# Patient Record
Sex: Female | Born: 1955 | Race: White | Hispanic: No | Marital: Married | Smoking: Never smoker
Health system: Southern US, Community
[De-identification: ages and names within clinical notes are randomized; demographics above are authoritative.]

## PROBLEM LIST (undated history)

## (undated) HISTORY — PX: OVARY SURGERY: SHX727

---

## 2005-03-26 ENCOUNTER — Ambulatory Visit: Payer: Self-pay | Admitting: Obstetrics and Gynecology

## 2006-04-08 ENCOUNTER — Ambulatory Visit: Payer: Self-pay | Admitting: Obstetrics and Gynecology

## 2006-04-18 ENCOUNTER — Ambulatory Visit: Payer: Self-pay | Admitting: Unknown Physician Specialty

## 2007-03-14 ENCOUNTER — Ambulatory Visit: Payer: Self-pay | Admitting: Unknown Physician Specialty

## 2007-05-05 ENCOUNTER — Ambulatory Visit: Payer: Self-pay | Admitting: Obstetrics and Gynecology

## 2008-06-04 ENCOUNTER — Ambulatory Visit: Payer: Self-pay | Admitting: Obstetrics and Gynecology

## 2009-06-06 ENCOUNTER — Ambulatory Visit: Payer: Self-pay | Admitting: Obstetrics and Gynecology

## 2010-07-07 ENCOUNTER — Ambulatory Visit: Payer: Self-pay | Admitting: Obstetrics and Gynecology

## 2011-08-11 ENCOUNTER — Ambulatory Visit: Payer: Self-pay | Admitting: Obstetrics and Gynecology

## 2012-08-15 ENCOUNTER — Ambulatory Visit: Payer: Self-pay | Admitting: Obstetrics and Gynecology

## 2013-08-21 ENCOUNTER — Ambulatory Visit: Payer: Self-pay | Admitting: Obstetrics and Gynecology

## 2014-09-24 ENCOUNTER — Ambulatory Visit
Admit: 2014-09-24 | Disposition: A | Discharge status: Home / Self Care | Payer: Self-pay | Attending: Obstetrics and Gynecology | Admitting: Obstetrics and Gynecology

## 2015-07-22 ENCOUNTER — Other Ambulatory Visit: Payer: Self-pay | Admitting: Obstetrics and Gynecology

## 2015-07-22 DIAGNOSIS — Z1231 Encounter for screening mammogram for malignant neoplasm of breast: Secondary | ICD-10-CM

## 2015-09-26 ENCOUNTER — Ambulatory Visit
Admission: RE | Admit: 2015-09-26 | Discharge: 2015-09-26 | Disposition: A | Discharge status: Home / Self Care | Payer: Managed Care, Other (non HMO) | Source: Ambulatory Visit | Attending: Obstetrics and Gynecology | Admitting: Obstetrics and Gynecology

## 2015-09-26 DIAGNOSIS — Z1231 Encounter for screening mammogram for malignant neoplasm of breast: Secondary | ICD-10-CM | POA: Diagnosis not present

## 2016-08-10 ENCOUNTER — Other Ambulatory Visit: Payer: Self-pay | Admitting: Obstetrics and Gynecology

## 2016-08-10 DIAGNOSIS — Z1231 Encounter for screening mammogram for malignant neoplasm of breast: Secondary | ICD-10-CM

## 2016-08-20 ENCOUNTER — Telehealth: Payer: Self-pay

## 2016-08-20 ENCOUNTER — Other Ambulatory Visit: Payer: Self-pay

## 2016-08-20 DIAGNOSIS — K279 Peptic ulcer, site unspecified, unspecified as acute or chronic, without hemorrhage or perforation: Secondary | ICD-10-CM | POA: Insufficient documentation

## 2016-08-20 DIAGNOSIS — K759 Inflammatory liver disease, unspecified: Secondary | ICD-10-CM | POA: Insufficient documentation

## 2016-08-20 DIAGNOSIS — E785 Hyperlipidemia, unspecified: Secondary | ICD-10-CM | POA: Insufficient documentation

## 2016-08-20 DIAGNOSIS — Z1211 Encounter for screening for malignant neoplasm of colon: Secondary | ICD-10-CM

## 2016-08-20 NOTE — Telephone Encounter (Signed)
LVM concerning re-scheduling appointment for colonoscopy from 09/10/16.

## 2016-08-20 NOTE — Telephone Encounter (Signed)
Gastroenterology Pre-Procedure Review  Request Date: 09/24/16 Requesting Physician: Dr. Allen Norris  PATIENT REVIEW QUESTIONS: The patient responded to the following health history questions as indicated:    1. Are you having any GI issues? no 2. Do you have a personal history of Polyps? no 3. Do you have a family history of Colon Cancer or Polyps? no 4. Diabetes Mellitus? no 5. Joint replacements in the past 12 months?no 6. Major health problems in the past 3 months?no 7. Any artificial heart valves, MVP, or defibrillator?no    MEDICATIONS & ALLERGIES:    Patient reports the following regarding taking any anticoagulation/antiplatelet therapy:   Plavix, Coumadin, Eliquis, Xarelto, Lovenox, Pradaxa, Brilinta, or Effient? no Aspirin? no  Patient confirms/reports the following medications:  No current outpatient prescriptions on file.   No current facility-administered medications for this visit.     Patient confirms/reports the following allergies:  Allergies not on file  No orders of the defined types were placed in this encounter.   AUTHORIZATION INFORMATION Primary Insurance: 1D#: Group #:  Secondary Insurance: 1D#: Group #:  SCHEDULE INFORMATION: Date: 09/25/16 Time: Location: [place]

## 2016-09-20 NOTE — Discharge Instructions (Signed)

## 2016-09-24 ENCOUNTER — Ambulatory Visit
Admission: RE | Admit: 2016-09-24 | Discharge: 2016-09-24 | Disposition: A | Discharge status: Home / Self Care | Payer: Managed Care, Other (non HMO) | Source: Ambulatory Visit | Attending: Gastroenterology | Admitting: Gastroenterology

## 2016-09-24 ENCOUNTER — Ambulatory Visit: Payer: Managed Care, Other (non HMO) | Admitting: Anesthesiology

## 2016-09-24 ENCOUNTER — Encounter
Admission: RE | Disposition: A | Discharge status: Home / Self Care | Payer: Self-pay | Source: Ambulatory Visit | Attending: Gastroenterology

## 2016-09-24 DIAGNOSIS — K635 Polyp of colon: Secondary | ICD-10-CM

## 2016-09-24 DIAGNOSIS — Z1211 Encounter for screening for malignant neoplasm of colon: Secondary | ICD-10-CM | POA: Diagnosis not present

## 2016-09-24 DIAGNOSIS — K641 Second degree hemorrhoids: Secondary | ICD-10-CM | POA: Diagnosis not present

## 2016-09-24 DIAGNOSIS — D125 Benign neoplasm of sigmoid colon: Secondary | ICD-10-CM | POA: Diagnosis not present

## 2016-09-24 DIAGNOSIS — Z8711 Personal history of peptic ulcer disease: Secondary | ICD-10-CM | POA: Insufficient documentation

## 2016-09-24 HISTORY — PX: POLYPECTOMY: SHX5525

## 2016-09-24 HISTORY — PX: COLONOSCOPY WITH PROPOFOL: SHX5780

## 2016-09-24 SURGERY — COLONOSCOPY WITH PROPOFOL
Anesthesia: Monitor Anesthesia Care | Wound class: Contaminated

## 2016-09-24 MED ORDER — OXYCODONE HCL 5 MG/5ML PO SOLN: 5.0000 mg | Freq: Once | ORAL | Status: DC | PRN

## 2016-09-24 MED ORDER — FENTANYL CITRATE (PF) 100 MCG/2ML IJ SOLN: 25.0000 ug | INTRAMUSCULAR | Status: DC | PRN

## 2016-09-24 MED ORDER — LIDOCAINE HCL (CARDIAC) 20 MG/ML IV SOLN
INTRAVENOUS | Status: DC | PRN
  Administered 2016-09-24: 40 mg via INTRAVENOUS

## 2016-09-24 MED ORDER — LACTATED RINGERS IV SOLN
INTRAVENOUS | Status: DC | PRN
  Administered 2016-09-24: 08:00:00 via INTRAVENOUS

## 2016-09-24 MED ORDER — DEXAMETHASONE SODIUM PHOSPHATE 4 MG/ML IJ SOLN: 8.0000 mg | Freq: Once | INTRAMUSCULAR | Status: DC | PRN

## 2016-09-24 MED ORDER — STERILE WATER FOR IRRIGATION IR SOLN
Status: DC | PRN
  Administered 2016-09-24: 08:00:00

## 2016-09-24 MED ORDER — OXYCODONE HCL 5 MG PO TABS: 5.0000 mg | ORAL_TABLET | Freq: Once | ORAL | Status: DC | PRN

## 2016-09-24 MED ORDER — ACETAMINOPHEN 325 MG PO TABS: 325.0000 mg | ORAL_TABLET | ORAL | Status: DC | PRN

## 2016-09-24 MED ORDER — PROPOFOL 10 MG/ML IV BOLUS
INTRAVENOUS | Status: DC | PRN
  Administered 2016-09-24: 50 mg via INTRAVENOUS
  Administered 2016-09-24: 20 mg via INTRAVENOUS
  Administered 2016-09-24: 50 mg via INTRAVENOUS
  Administered 2016-09-24: 100 mg via INTRAVENOUS
  Administered 2016-09-24: 50 mg via INTRAVENOUS

## 2016-09-24 MED ORDER — ACETAMINOPHEN 160 MG/5ML PO SOLN: 325.0000 mg | ORAL | Status: DC | PRN

## 2016-09-24 SURGICAL SUPPLY — 23 items

## 2016-09-24 NOTE — H&P (Signed)
  Lucilla Lame, MD [address], [place] [address] Phone: [phone] Fax : [phone]  Primary Care Physician:  Idelle Crouch, MD Primary Gastroenterologist:  Dr. Allen Norris  Pre-Procedure History & Physical: HPI:  Karen Tran is a 61 y.o. female is here for a screening colonoscopy.   History reviewed. No pertinent past medical history.  Past Surgical History:  Procedure Laterality Date  . OVARY SURGERY     removal    Prior to Admission medications   Medication Sig Start Date End Date Taking? Authorizing Provider  Multiple Vitamin (MULTIVITAMIN) tablet Take 1 tablet by mouth daily.   Yes Historical Provider, MD  Omega-3 Fatty Acids (FISH OIL CONCENTRATE PO) Take by mouth daily.   Yes Historical Provider, MD    Allergies as of 08/20/2016  . (Not on File)    Family History  Problem Relation Age of Onset  . Breast cancer Neg Hx     Social History   Social History  . Marital status: Married    Spouse name: N/A  . Number of children: N/A  . Years of education: N/A   Occupational History  . Not on file.   Social History Main Topics  . Smoking status: Never Smoker  . Smokeless tobacco: Never Used  . Alcohol use Yes     Comment: socially  . Drug use: No  . Sexual activity: Not on file   Other Topics Concern  . Not on file   Social History Narrative  . No narrative on file    Review of Systems: See HPI, otherwise negative ROS  Physical Exam: BP (!) 110/49   Pulse 66   Temp 97.8 F (36.6 C) (Temporal)   Ht 5\' 5"  (1.651 m)   Wt 117 lb (53.1 kg)   SpO2 100%   BMI 19.47 kg/m  General:   Alert,  pleasant and cooperative in NAD Head:  Normocephalic and atraumatic. Neck:  Supple; no masses or thyromegaly. Lungs:  Clear throughout to auscultation.    Heart:  Regular rate and rhythm. Abdomen:  Soft, nontender and nondistended. Normal bowel sounds, without guarding, and without rebound.   Neurologic:  Alert and  oriented x4;   grossly normal neurologically.  Impression/Plan: Karen Tran is now here to undergo a screening colonoscopy.  Risks, benefits, and alternatives regarding colonoscopy have been reviewed with the patient.  Questions have been answered.  All parties agreeable.

## 2016-09-24 NOTE — H&P (Signed)
  Lucilla Lame, MD [address] [place], [place] [address] Phone: [phone] Fax : [phone]  Primary Care Physician:  Idelle Crouch, MD Primary Gastroenterologist:  Dr. Allen Norris  Pre-Procedure History & Physical: HPI:  Karen Tran is a 61 y.o. female is here for a screening colonoscopy.   History reviewed. No pertinent past medical history.  Past Surgical History:  Procedure Laterality Date  . OVARY SURGERY     removal    Prior to Admission medications   Medication Sig Start Date End Date Taking? Authorizing Provider  Multiple Vitamin (MULTIVITAMIN) tablet Take 1 tablet by mouth daily.   Yes Historical Provider, MD  Omega-3 Fatty Acids (FISH OIL CONCENTRATE PO) Take by mouth daily.   Yes Historical Provider, MD    Allergies as of 08/20/2016  . (Not on File)    Family History  Problem Relation Age of Onset  . Breast cancer Neg Hx     Social History   Social History  . Marital status: Married    Spouse name: N/A  . Number of children: N/A  . Years of education: N/A   Occupational History  . Not on file.   Social History Main Topics  . Smoking status: Never Smoker  . Smokeless tobacco: Never Used  . Alcohol use Yes     Comment: socially  . Drug use: No  . Sexual activity: Not on file   Other Topics Concern  . Not on file   Social History Narrative  . No narrative on file    Review of Systems: See HPI, otherwise negative ROS  Physical Exam: BP (!) 110/49   Pulse 66   Temp 97.8 F (36.6 C) (Temporal)   Ht 5\' 5"  (1.651 m)   Wt 117 lb (53.1 kg)   SpO2 100%   BMI 19.47 kg/m  General:   Alert,  pleasant and cooperative in NAD Head:  Normocephalic and atraumatic. Neck:  Supple; no masses or thyromegaly. Lungs:  Clear throughout to auscultation.    Heart:  Regular rate and rhythm. Abdomen:  Soft, nontender and nondistended. Normal bowel sounds, without guarding, and without rebound.   Neurologic:  Alert and  oriented x4;   grossly normal neurologically.  Impression/Plan: Karen Tran is now here to undergo a screening colonoscopy.  Risks, benefits, and alternatives regarding colonoscopy have been reviewed with the patient.  Questions have been answered.  All parties agreeable.

## 2016-09-24 NOTE — Anesthesia Preprocedure Evaluation (Signed)
Anesthesia Evaluation  Patient identified by MRN, date of birth, ID band Patient awake    Reviewed: Allergy & Precautions, H&P , NPO status , Patient's Chart, lab work & pertinent test results, reviewed documented beta blocker date and time   Airway Mallampati: I  TM Distance: >3 FB Neck ROM: full    Dental no notable dental hx.    Pulmonary neg pulmonary ROS,    Pulmonary exam normal breath sounds clear to auscultation       Cardiovascular Exercise Tolerance: Good negative cardio ROS   Rhythm:regular Rate:Normal     Neuro/Psych negative neurological ROS  negative psych ROS   GI/Hepatic PUD, (+) Hepatitis -  Endo/Other  negative endocrine ROS  Renal/GU negative Renal ROS  negative genitourinary   Musculoskeletal   Abdominal   Peds  Hematology negative hematology ROS (+)   Anesthesia Other Findings   Reproductive/Obstetrics negative OB ROS                             Anesthesia Physical Anesthesia Plan  ASA: II  Anesthesia Plan: MAC   Post-op Pain Management:    Induction:   Airway Management Planned:   Additional Equipment:   Intra-op Plan:   Post-operative Plan:   Informed Consent: I have reviewed the patients History and Physical, chart, labs and discussed the procedure including the risks, benefits and alternatives for the proposed anesthesia with the patient or authorized representative who has indicated his/her understanding and acceptance.     Plan Discussed with: CRNA  Anesthesia Plan Comments:         Anesthesia Quick Evaluation

## 2016-09-24 NOTE — Transfer of Care (Signed)
Immediate Anesthesia Transfer of Care Note  Patient: [place]  Procedure(s) Performed: Procedure(s): COLONOSCOPY WITH PROPOFOL (N/A) POLYPECTOMY  Patient Location: PACU  Anesthesia Type: MAC  Level of Consciousness: awake, alert  and patient cooperative  Airway and Oxygen Therapy: Patient Spontanous Breathing and Patient connected to supplemental oxygen  Post-op Assessment: Post-op Vital signs reviewed, Patient's Cardiovascular Status Stable, Respiratory Function Stable, Patent Airway and No signs of Nausea or vomiting  Post-op Vital Signs: Reviewed and stable  Complications: No apparent anesthesia complications

## 2016-09-24 NOTE — Anesthesia Postprocedure Evaluation (Signed)
Anesthesia Post Note  Patient: Sandford Craze  Procedure(s) Performed: Procedure(s) (LRB): COLONOSCOPY WITH PROPOFOL (N/A) POLYPECTOMY  Patient location during evaluation: PACU Anesthesia Type: MAC Level of consciousness: awake and alert Pain management: pain level controlled Vital Signs Assessment: post-procedure vital signs reviewed and stable Respiratory status: spontaneous breathing, nonlabored ventilation and respiratory function stable Cardiovascular status: stable and blood pressure returned to baseline Anesthetic complications: no    Jenavieve Freda D Chelsee Hosie

## 2016-09-24 NOTE — Anesthesia Procedure Notes (Signed)
Procedure Name: MAC Date/Time: 09/24/2016 8:00 AM Performed by: Janna Arch Pre-anesthesia Checklist: Patient identified, Emergency Drugs available, Suction available and Patient being monitored Patient Re-evaluated:Patient Re-evaluated prior to inductionOxygen Delivery Method: Nasal cannula

## 2016-09-24 NOTE — Op Note (Signed)
[facility] Gastroenterology Patient Name: Karen Tran Procedure Date: 09/24/2016 7:57 AM MRN: [id number] Account #: [id number] Date of Birth: 1956/04/29 Admit Type: Outpatient Age: 61 Room: [facility] OR ROOM 01 Gender: Female Note Status: Finalized Procedure:            Colonoscopy Indications:          Screening for colorectal malignant neoplasm Providers:            Lucilla Lame MD, MD Referring MD:         Boykin Nearing, MD (Referring MD) Medicines:            Propofol per Anesthesia Complications:        No immediate complications. Procedure:            Pre-Anesthesia Assessment:                       - Prior to the procedure, a History and Physical was                        performed, and patient medications and allergies were                        reviewed. The patient's tolerance of previous                        anesthesia was also reviewed. The risks and benefits of                        the procedure and the sedation options and risks were                        discussed with the patient. All questions were                        answered, and informed consent was obtained. Prior                        Anticoagulants: The patient has taken no previous                        anticoagulant or antiplatelet agents. ASA Grade                        Assessment: II - A patient with mild systemic disease.                        After reviewing the risks and benefits, the patient was                        deemed in satisfactory condition to undergo the                        procedure.                       After obtaining informed consent, the colonoscope was                        passed under direct vision. Throughout the procedure,  the patient's blood pressure, pulse, and oxygen                        saturations were monitored continuously. The Olympus                        Colonoscope 190 [id number]) was introduced  through the                        anus and advanced to the the cecum, identified by                        appendiceal orifice and ileocecal valve. The                        colonoscopy was performed without difficulty. The                        patient tolerated the procedure well. The quality of                        the bowel preparation was excellent. Findings:      The perianal and digital rectal examinations were normal.      A 2 mm polyp was found in the sigmoid colon. The polyp was sessile. The       polyp was removed with a cold biopsy forceps. Resection and retrieval       were complete.      Non-bleeding internal hemorrhoids were found during retroflexion. The       hemorrhoids were Grade II (internal hemorrhoids that prolapse but reduce       spontaneously). Impression:           - One 2 mm polyp in the sigmoid colon, removed with a                        cold biopsy forceps. Resected and retrieved.                       - Non-bleeding internal hemorrhoids. Recommendation:       - Discharge patient to home.                       - Resume previous diet.                       - Continue present medications.                       - Await pathology results.                       - Repeat colonoscopy in 5 years if polyp adenoma and 10                        years if hyperplastic Procedure Code(s):    --- Professional ---                       [phone], Colonoscopy, flexible; with biopsy, single or                        multiple Diagnosis  Code(s):    --- Professional ---                       Z12.11, Encounter for screening for malignant neoplasm                        of colon                       D12.5, Benign neoplasm of sigmoid colon CPT copyright 2016 American Medical Association. All rights reserved. The codes documented in this report are preliminary and upon coder review may  be revised to meet current compliance requirements. Lucilla Lame MD, MD 09/24/2016 8:23:24  AM This report has been signed electronically. Number of Addenda: 0 Note Initiated On: 09/24/2016 7:57 AM Scope Withdrawal Time: 0 hours 7 minutes 45 seconds  Total Procedure Duration: 0 hours 15 minutes 31 seconds       [facility]

## 2016-09-27 ENCOUNTER — Encounter: Payer: Self-pay | Admitting: Gastroenterology

## 2016-09-28 ENCOUNTER — Encounter: Payer: Self-pay | Admitting: Gastroenterology

## 2016-09-28 ENCOUNTER — Ambulatory Visit
Admission: RE | Admit: 2016-09-28 | Discharge: 2016-09-28 | Disposition: A | Discharge status: Home / Self Care | Payer: Managed Care, Other (non HMO) | Source: Ambulatory Visit | Attending: Obstetrics and Gynecology | Admitting: Obstetrics and Gynecology

## 2016-09-28 DIAGNOSIS — Z1231 Encounter for screening mammogram for malignant neoplasm of breast: Secondary | ICD-10-CM | POA: Diagnosis present

## 2016-10-05 ENCOUNTER — Other Ambulatory Visit: Payer: Self-pay

## 2017-08-16 ENCOUNTER — Other Ambulatory Visit: Payer: Self-pay | Admitting: Obstetrics and Gynecology

## 2017-08-16 DIAGNOSIS — Z1231 Encounter for screening mammogram for malignant neoplasm of breast: Secondary | ICD-10-CM

## 2017-10-05 ENCOUNTER — Ambulatory Visit
Admission: RE | Admit: 2017-10-05 | Discharge: 2017-10-05 | Disposition: A | Discharge status: Home / Self Care | Payer: Managed Care, Other (non HMO) | Source: Ambulatory Visit | Attending: Obstetrics and Gynecology | Admitting: Obstetrics and Gynecology

## 2017-10-05 DIAGNOSIS — Z1231 Encounter for screening mammogram for malignant neoplasm of breast: Secondary | ICD-10-CM | POA: Diagnosis present

## 2018-05-22 IMAGING — MG MM DIGITAL SCREENING BILAT W/ TOMO W/ CAD
9 of 12 series · 9 of 28 positions shown · non-contrast
Comparison: Previous exam(s).

CLINICAL DATA: Screening.

EXAM:
DIGITAL SCREENING BILATERAL MAMMOGRAM WITH TOMO AND CAD

[R MLO]
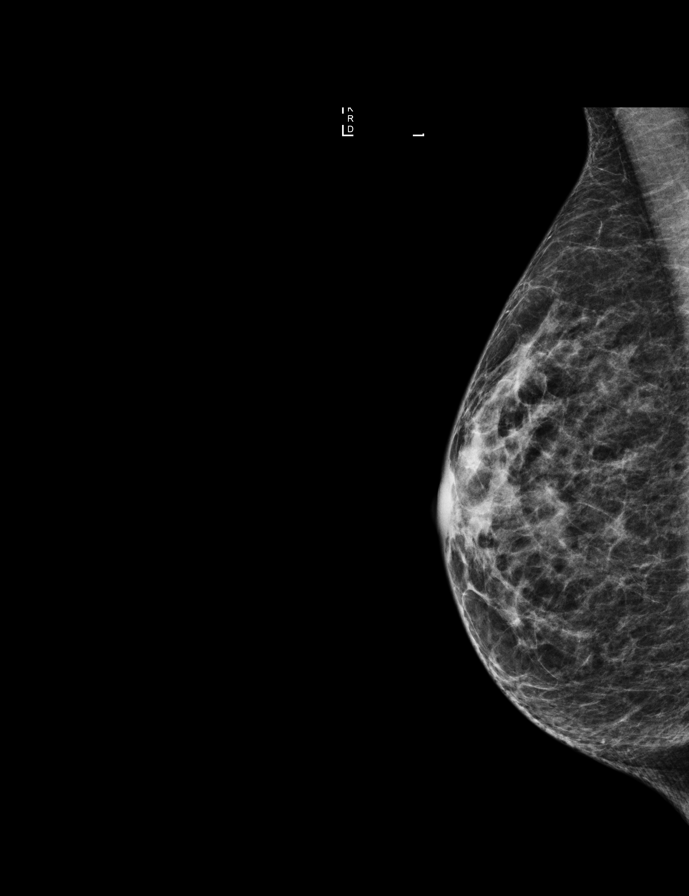

[R CC synth-2D]
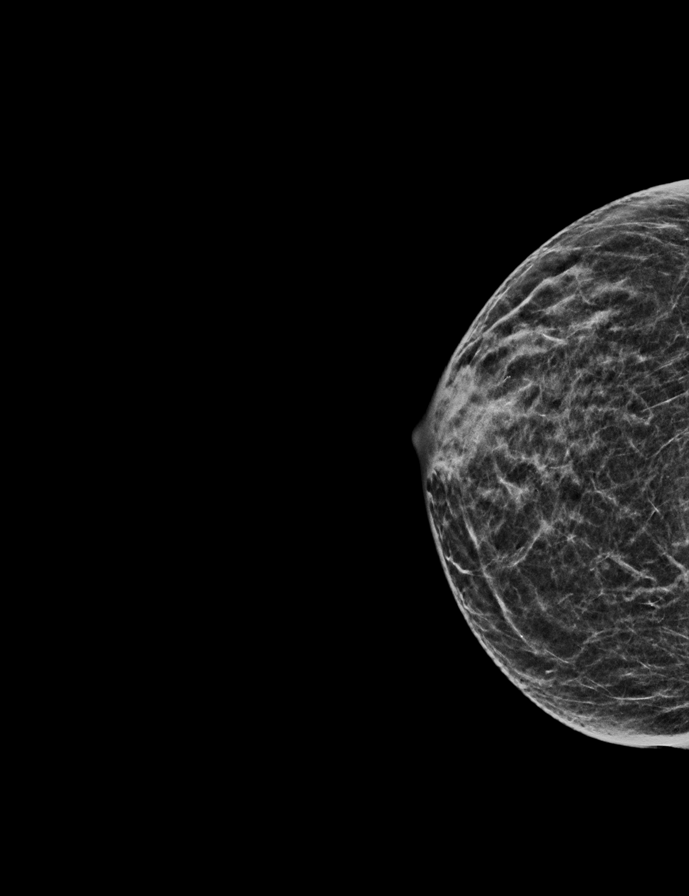

[R MLO synth-2D]
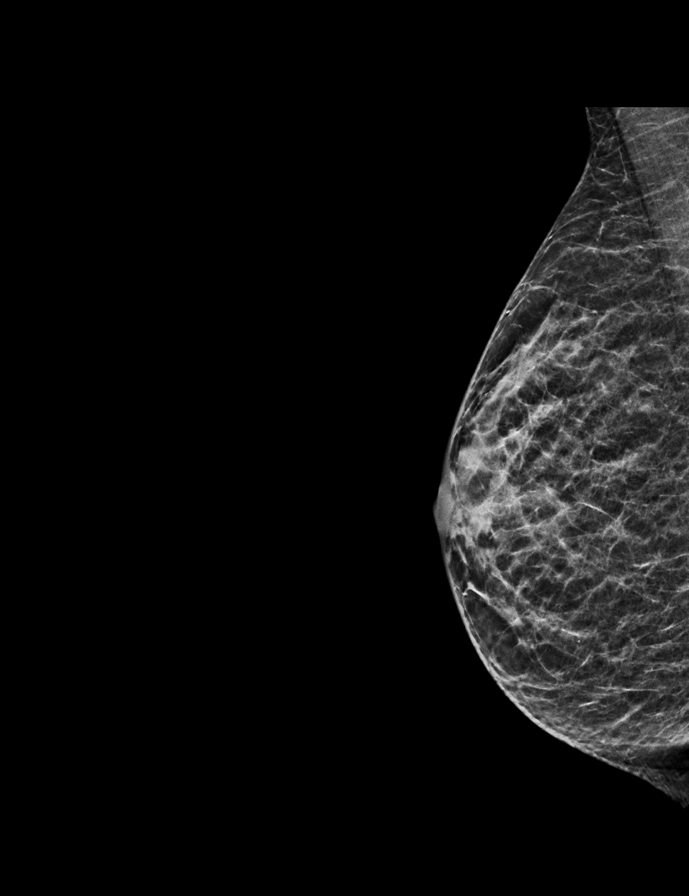

[L MLO synth-2D]
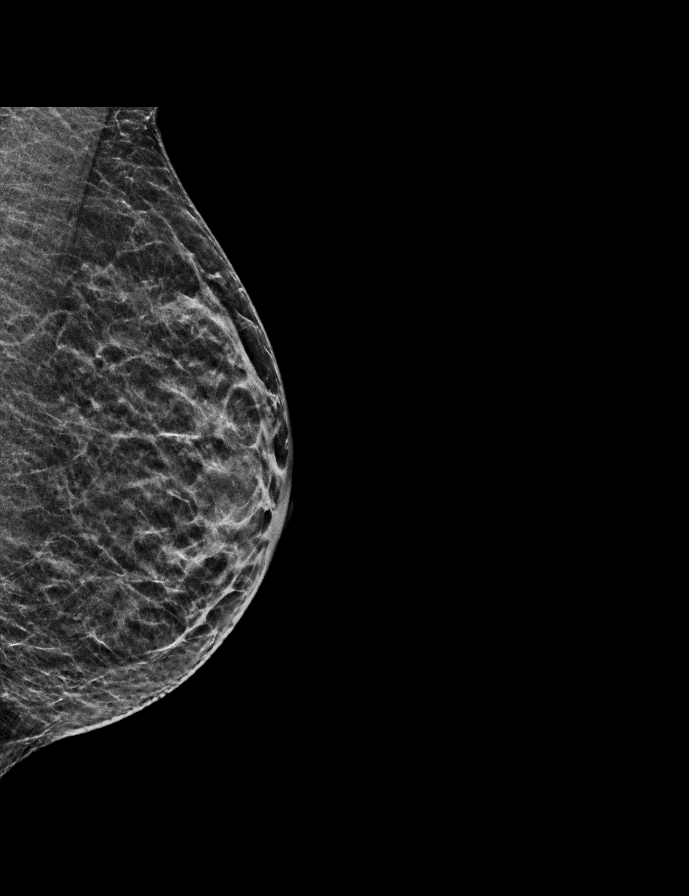

[L MLO]
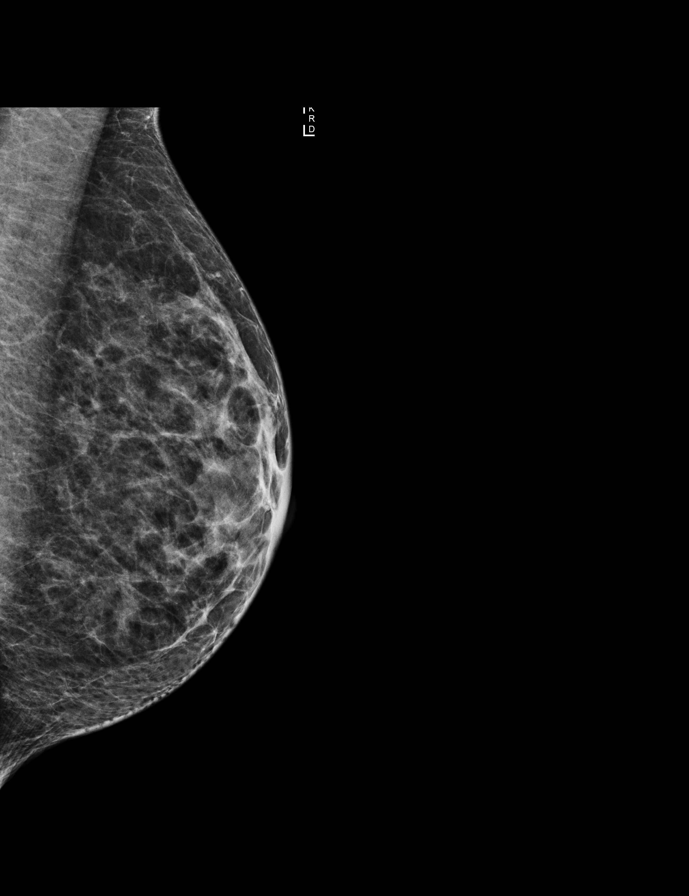

[R CC]
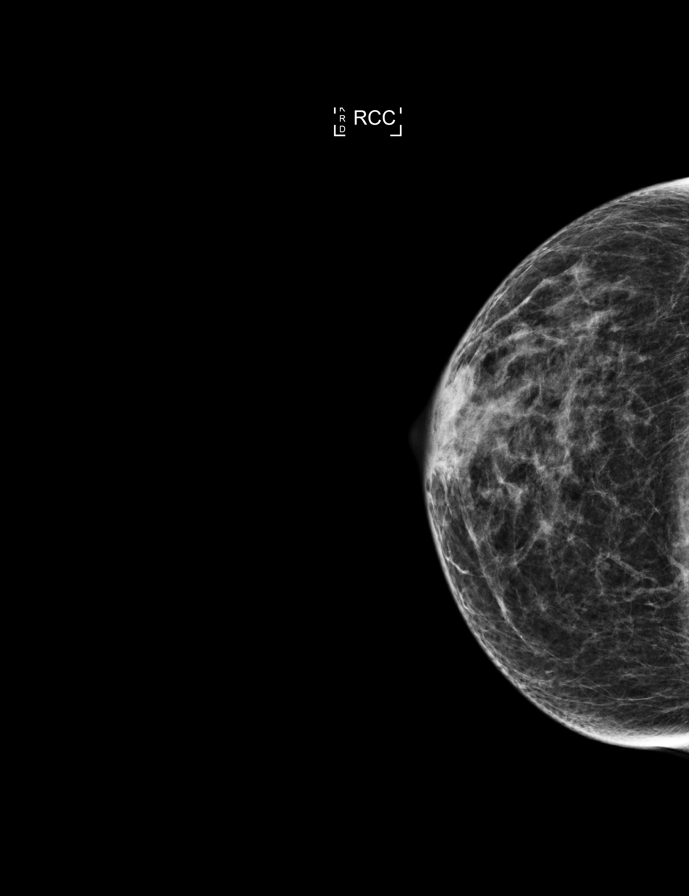

[L CC synth-2D]
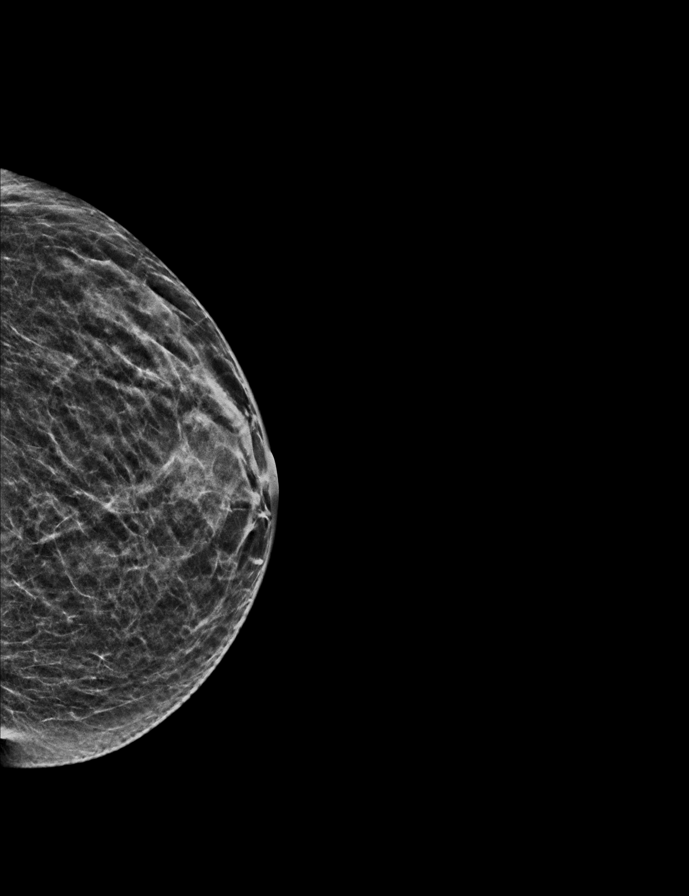

[L CC]
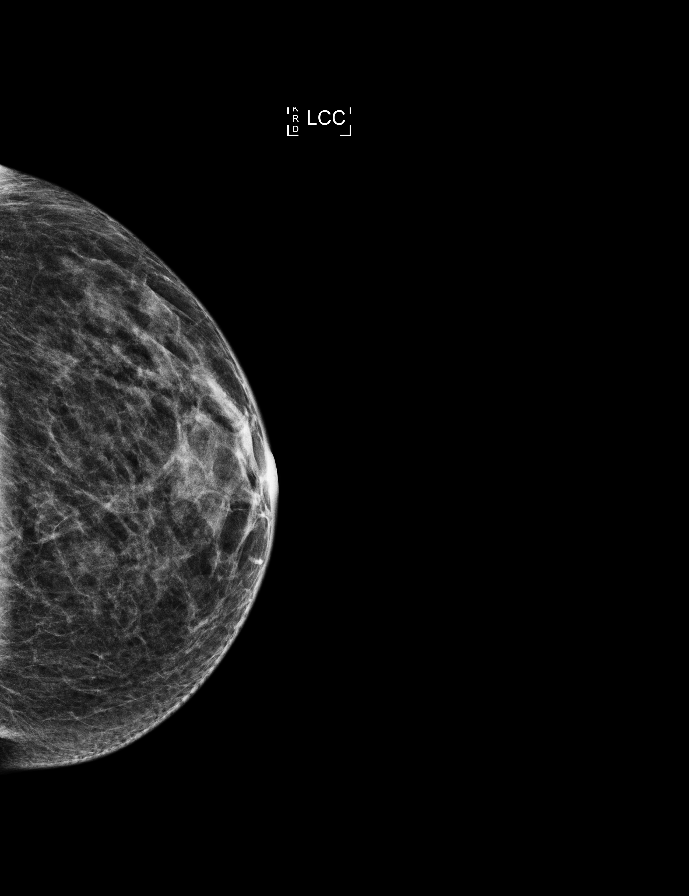

[L CC tomo · tomo slice 19/36.0]
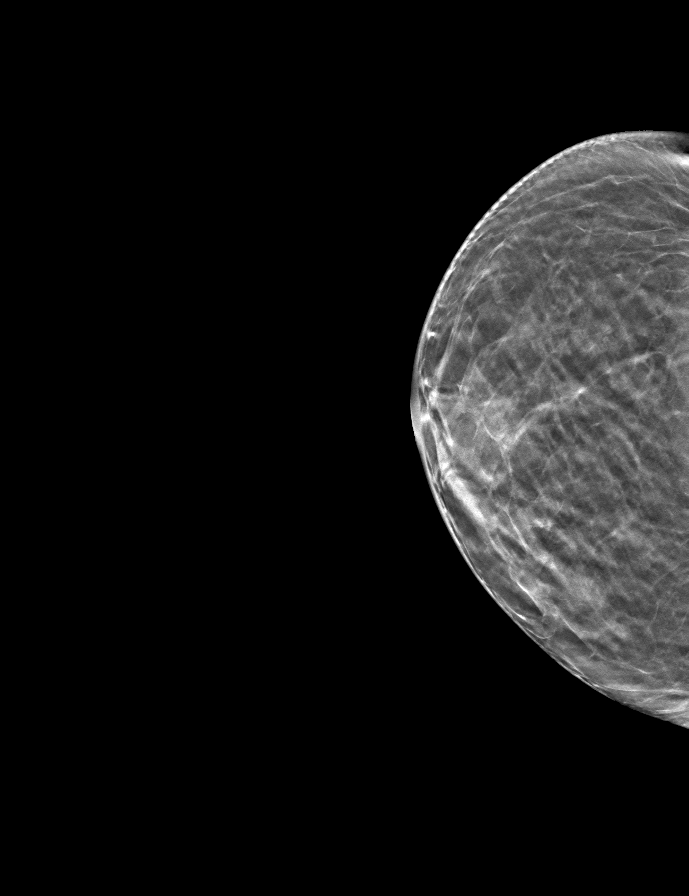

[9 of 28 positions shown; findings below may reference images not displayed]

ACR Breast Density Category c: The breast tissue is heterogeneously
dense, which may obscure small masses.
FINDINGS: There are no findings suspicious for malignancy. Images were
processed with CAD.
IMPRESSION: No mammographic evidence of malignancy. A result letter of this
screening mammogram will be mailed directly to the patient.

RECOMMENDATION:
Screening mammogram in one year. (Code:FT-U-LHB)

BI-RADS CATEGORY  1: Negative.

## 2018-08-22 ENCOUNTER — Other Ambulatory Visit: Payer: Self-pay | Admitting: Obstetrics and Gynecology

## 2018-08-22 DIAGNOSIS — Z1231 Encounter for screening mammogram for malignant neoplasm of breast: Secondary | ICD-10-CM

## 2018-11-30 ENCOUNTER — Ambulatory Visit
Admission: RE | Admit: 2018-11-30 | Discharge: 2018-11-30 | Disposition: A | Discharge status: Home / Self Care | Payer: Managed Care, Other (non HMO) | Source: Ambulatory Visit | Attending: Obstetrics and Gynecology | Admitting: Obstetrics and Gynecology

## 2018-11-30 ENCOUNTER — Other Ambulatory Visit: Payer: Self-pay

## 2018-11-30 DIAGNOSIS — Z1231 Encounter for screening mammogram for malignant neoplasm of breast: Secondary | ICD-10-CM

## 2019-09-27 ENCOUNTER — Other Ambulatory Visit: Payer: Self-pay | Admitting: Obstetrics and Gynecology

## 2019-09-27 DIAGNOSIS — Z1231 Encounter for screening mammogram for malignant neoplasm of breast: Secondary | ICD-10-CM

## 2019-12-03 ENCOUNTER — Ambulatory Visit
Admission: RE | Admit: 2019-12-03 | Discharge: 2019-12-03 | Disposition: A | Discharge status: Home / Self Care | Payer: Managed Care, Other (non HMO) | Source: Ambulatory Visit | Attending: Obstetrics and Gynecology | Admitting: Obstetrics and Gynecology

## 2019-12-03 DIAGNOSIS — Z1231 Encounter for screening mammogram for malignant neoplasm of breast: Secondary | ICD-10-CM | POA: Diagnosis not present

## 2020-10-15 ENCOUNTER — Other Ambulatory Visit: Payer: Self-pay | Admitting: Obstetrics and Gynecology

## 2020-10-15 DIAGNOSIS — Z1231 Encounter for screening mammogram for malignant neoplasm of breast: Secondary | ICD-10-CM

## 2020-12-03 ENCOUNTER — Other Ambulatory Visit: Payer: Self-pay

## 2020-12-03 ENCOUNTER — Ambulatory Visit
Admission: RE | Admit: 2020-12-03 | Discharge: 2020-12-03 | Disposition: A | Discharge status: Home / Self Care | Payer: Managed Care, Other (non HMO) | Source: Ambulatory Visit | Attending: Obstetrics and Gynecology | Admitting: Obstetrics and Gynecology

## 2020-12-03 DIAGNOSIS — Z1231 Encounter for screening mammogram for malignant neoplasm of breast: Secondary | ICD-10-CM

## 2021-08-31 ENCOUNTER — Other Ambulatory Visit: Payer: Self-pay

## 2021-08-31 DIAGNOSIS — Z1211 Encounter for screening for malignant neoplasm of colon: Secondary | ICD-10-CM

## 2021-08-31 MED ORDER — NA SULFATE-K SULFATE-MG SULF 17.5-3.13-1.6 GM/177ML PO SOLN: 1.0000 | Freq: Once | ORAL | 0 refills | Status: DC

## 2021-08-31 MED ORDER — PEG 3350-KCL-NA BICARB-NACL 420 G PO SOLR: 4000.0000 mL | Freq: Once | ORAL | 0 refills | Status: AC

## 2021-08-31 NOTE — Progress Notes (Signed)
Gastroenterology Pre-Procedure Review ? ?Request Date: 09/21/2021 ?Requesting Physician: Dr. Allen Norris ? ?PATIENT REVIEW QUESTIONS: The patient responded to the following health history questions as indicated:  5 year recall ? ?1. Are you having any GI issues? no ?2. Do you have a personal history of Polyps? yes (09/2016 polyp removed.) ?3. Do you have a family history of Colon Cancer or Polyps? no ?4. Diabetes Mellitus? no ?5. Joint replacements in the past 12 months?no ?6. Major health problems in the past 3 months?no ?7. Any artificial heart valves, MVP, or defibrillator?no ?   ?MEDICATIONS & ALLERGIES:    ?Patient reports the following regarding taking any anticoagulation/antiplatelet therapy:   ?Plavix, Coumadin, Eliquis, Xarelto, Lovenox, Pradaxa, Brilinta, or Effient? no ?Aspirin? no ? ?Patient confirms/reports the following medications:  ?Current Outpatient Medications  ?Medication Sig Dispense Refill  ? Multiple Vitamin (MULTIVITAMIN) tablet Take 1 tablet by mouth daily.    ? Omega-3 Fatty Acids (FISH OIL CONCENTRATE PO) Take by mouth daily.    ? ?No current facility-administered medications for this visit.  ? ? ?Patient confirms/reports the following allergies:  ?No Known Allergies ? ?No orders of the defined types were placed in this encounter. ? ? ?AUTHORIZATION INFORMATION ?Primary Insurance: ?1D#: ?Group #: ? ?Secondary Insurance: ?1D#: ?Group #: ? ?SCHEDULE INFORMATION: ?Date: 09/21/2021 ?Time: ?Location: [place] ? ?

## 2021-08-31 NOTE — Progress Notes (Signed)
Patient is requesting a cheaper prep. Suprep cost was expensive. Instructions included on prep. ?

## 2021-09-15 ENCOUNTER — Encounter: Payer: Self-pay | Admitting: Gastroenterology

## 2021-09-21 ENCOUNTER — Ambulatory Visit
Admission: RE | Admit: 2021-09-21 | Discharge: 2021-09-21 | Disposition: A | Discharge status: Home / Self Care | Payer: Managed Care, Other (non HMO) | Source: Ambulatory Visit | Attending: Gastroenterology | Admitting: Gastroenterology

## 2021-09-21 ENCOUNTER — Ambulatory Visit: Payer: Managed Care, Other (non HMO) | Admitting: Anesthesiology

## 2021-09-21 ENCOUNTER — Encounter: Payer: Self-pay | Admitting: Gastroenterology

## 2021-09-21 ENCOUNTER — Encounter
Admission: RE | Disposition: A | Discharge status: Home / Self Care | Payer: Self-pay | Source: Ambulatory Visit | Attending: Gastroenterology

## 2021-09-21 ENCOUNTER — Other Ambulatory Visit: Payer: Self-pay

## 2021-09-21 DIAGNOSIS — Z860100 Personal history of colon polyps, unspecified: Secondary | ICD-10-CM

## 2021-09-21 DIAGNOSIS — Z1211 Encounter for screening for malignant neoplasm of colon: Secondary | ICD-10-CM | POA: Diagnosis present

## 2021-09-21 DIAGNOSIS — Z8711 Personal history of peptic ulcer disease: Secondary | ICD-10-CM | POA: Diagnosis not present

## 2021-09-21 DIAGNOSIS — K640 First degree hemorrhoids: Secondary | ICD-10-CM | POA: Diagnosis not present

## 2021-09-21 HISTORY — PX: COLONOSCOPY WITH PROPOFOL: SHX5780

## 2021-09-21 SURGERY — COLONOSCOPY WITH PROPOFOL
Anesthesia: General | Site: Rectum

## 2021-09-21 MED ORDER — LACTATED RINGERS IV SOLN: INTRAVENOUS | Status: DC

## 2021-09-21 MED ORDER — STERILE WATER FOR IRRIGATION IR SOLN
Status: DC | PRN
  Administered 2021-09-21: 1

## 2021-09-21 MED ORDER — SODIUM CHLORIDE 0.9 % IV SOLN: INTRAVENOUS | Status: DC

## 2021-09-21 MED ORDER — LIDOCAINE HCL (CARDIAC) PF 100 MG/5ML IV SOSY
PREFILLED_SYRINGE | INTRAVENOUS | Status: DC | PRN
  Administered 2021-09-21: 30 mg via INTRAVENOUS

## 2021-09-21 MED ORDER — ACETAMINOPHEN 325 MG PO TABS: 325.0000 mg | ORAL_TABLET | ORAL | Status: DC | PRN

## 2021-09-21 MED ORDER — PROPOFOL 10 MG/ML IV BOLUS
INTRAVENOUS | Status: DC | PRN
  Administered 2021-09-21: 40 mg via INTRAVENOUS
  Administered 2021-09-21: 20 mg via INTRAVENOUS
  Administered 2021-09-21: 30 mg via INTRAVENOUS
  Administered 2021-09-21 (×2): 40 mg via INTRAVENOUS
  Administered 2021-09-21: 30 mg via INTRAVENOUS
  Administered 2021-09-21: 120 mg via INTRAVENOUS

## 2021-09-21 MED ORDER — ACETAMINOPHEN 160 MG/5ML PO SOLN: 325.0000 mg | ORAL | Status: DC | PRN

## 2021-09-21 MED ORDER — ONDANSETRON HCL 4 MG/2ML IJ SOLN: 4.0000 mg | Freq: Once | INTRAMUSCULAR | Status: DC | PRN

## 2021-09-21 SURGICAL SUPPLY — 6 items
GOWN CVR UNV OPN BCK APRN NK (MISCELLANEOUS) ×2 IMPLANT
GOWN ISOL THUMB LOOP REG UNIV (MISCELLANEOUS) ×4
KIT PRC NS LF DISP ENDO (KITS) ×1 IMPLANT
KIT PROCEDURE OLYMPUS (KITS) ×2
MANIFOLD NEPTUNE II (INSTRUMENTS) ×2 IMPLANT
WATER STERILE IRR 250ML POUR (IV SOLUTION) ×2 IMPLANT

## 2021-09-21 NOTE — Anesthesia Postprocedure Evaluation (Signed)
Anesthesia Post Note ? ?Patient: Karen Tran ? ?Procedure(s) Performed: COLONOSCOPY WITH PROPOFOL (Rectum) ? ? ?  ?Patient location during evaluation: PACU ?Anesthesia Type: General ?Level of consciousness: awake and alert ?Pain management: pain level controlled ?Vital Signs Assessment: post-procedure vital signs reviewed and stable ?Respiratory status: spontaneous breathing, nonlabored ventilation and respiratory function stable ?Cardiovascular status: blood pressure returned to baseline and stable ?Postop Assessment: no apparent nausea or vomiting ?Anesthetic complications: no ? ? ?No notable events documented. ? ?April Manson ? ? ? ? ? ?

## 2021-09-21 NOTE — Transfer of Care (Signed)
Immediate Anesthesia Transfer of Care Note ? ?Patient: Karen Tran ? ?Procedure(s) Performed: COLONOSCOPY WITH PROPOFOL (Rectum) ? ?Patient Location: PACU ? ?Anesthesia Type: General ? ?Level of Consciousness: awake, alert  and patient cooperative ? ?Airway and Oxygen Therapy: Patient Spontanous Breathing and Patient connected to supplemental oxygen ? ?Post-op Assessment: Post-op Vital signs reviewed, Patient's Cardiovascular Status Stable, Respiratory Function Stable, Patent Airway and No signs of Nausea or vomiting ? ?Post-op Vital Signs: Reviewed and stable ? ?Complications: No notable events documented. ? ?

## 2021-09-21 NOTE — Anesthesia Procedure Notes (Signed)
Date/Time: 09/21/2021 7:35 AM ?Performed by: Cameron Ali, CRNA ?Pre-anesthesia Checklist: Patient identified, Emergency Drugs available, Suction available, Timeout performed and Patient being monitored ?Patient Re-evaluated:Patient Re-evaluated prior to induction ?Oxygen Delivery Method: Nasal cannula ?Placement Confirmation: positive ETCO2 ? ? ? ? ?

## 2021-09-21 NOTE — H&P (Signed)
? ?  Lucilla Lame, MD [facility] ?[address] ?[place], [place] [address] ?Phone:[phone] ?Fax : [phone] ? ?Primary Care Physician:  Idelle Crouch, MD ?Primary Gastroenterologist:  Dr. Allen Norris ? ?Pre-Procedure History & Physical: ?HPI:  Karen Tran is a 66 y.o. female is here for an colonoscopy. ?  ?History reviewed. No pertinent past medical history. ? ?Past Surgical History:  ?Procedure Laterality Date  ? COLONOSCOPY WITH PROPOFOL N/A 09/24/2016  ? Procedure: COLONOSCOPY WITH PROPOFOL;  Surgeon: Lucilla Lame, MD;  Location: [facility];  Service: Endoscopy;  Laterality: N/A;  ? OVARY SURGERY    ? removal  ? POLYPECTOMY  09/24/2016  ? Procedure: POLYPECTOMY;  Surgeon: Lucilla Lame, MD;  Location: [facility];  Service: Endoscopy;;  ? ? ?Prior to Admission medications   ?Medication Sig Start Date End Date Taking? Authorizing Provider  ?gabapentin (NEURONTIN) 300 MG capsule Take 300 mg by mouth 2 (two) times daily.   Yes [provider]  ?Multiple Vitamin (MULTIVITAMIN) tablet Take 1 tablet by mouth daily.   Yes [provider]  ?Omega-3 Fatty Acids (FISH OIL CONCENTRATE PO) Take by mouth daily.   Yes [provider]  ? ? ?Allergies as of 08/31/2021  ? (No Known Allergies)  ? ? ?Family History  ?Problem Relation Age of Onset  ? Breast cancer Neg Hx   ? ? ?Social History  ? ?Socioeconomic History  ? Marital status: Married  ?  Spouse name: Not on file  ? Number of children: Not on file  ? Years of education: Not on file  ? Highest education level: Not on file  ?Occupational History  ? Not on file  ?Tobacco Use  ? Smoking status: Never  ? Smokeless tobacco: Never  ?Vaping Use  ? Vaping Use: Never used  ?Substance and Sexual Activity  ? Alcohol use: Yes  ?  Comment: socially  ? Drug use: No  ? Sexual activity: Not on file  ?Other Topics Concern  ? Not on file  ?Social History Narrative  ? Not on file  ? ?Social Determinants of Health  ? ?Financial Resource Strain:  Not on file  ?Food Insecurity: Not on file  ?Transportation Needs: Not on file  ?Physical Activity: Not on file  ?Stress: Not on file  ?Social Connections: Not on file  ?Intimate Partner Violence: Not on file  ? ? ?Review of Systems: ?See HPI, otherwise negative ROS ? ?Physical Exam: ?BP 137/60   Pulse 66   Temp (!) 97.5 ?F (36.4 ?C) (Temporal)   Ht '5\' 5"'$  (1.651 m)   Wt 53.5 kg   SpO2 100%   BMI 19.64 kg/m?  ?General:   Alert,  pleasant and cooperative in NAD ?Head:  Normocephalic and atraumatic. ?Neck:  Supple; no masses or thyromegaly. ?Lungs:  Clear throughout to auscultation.    ?Heart:  Regular rate and rhythm. ?Abdomen:  Soft, nontender and nondistended. Normal bowel sounds, without guarding, and without rebound.   ?Neurologic:  Alert and  oriented x4;  grossly normal neurologically. ? ?Impression/Plan: ?[place] L Ricklefs is here for an colonoscopy to be performed for a history of adenomatous polyps on a history of adenomatous polyps on 2018 ? ? ? ?Risks, benefits, limitations, and alternatives regarding  colonoscopy have been reviewed with the patient.  Questions have been answered.  All parties agreeable. ? ? ?Lucilla Lame, MD  09/21/2021, 7:24 AM ?

## 2021-09-21 NOTE — Anesthesia Preprocedure Evaluation (Signed)
Anesthesia Evaluation  ?Patient identified by MRN, date of birth, ID band ?Patient awake ? ? ? ?Reviewed: ?Allergy & Precautions, H&P , NPO status , Patient's Chart, lab work & pertinent test results, reviewed documented beta blocker date and time  ? ?Airway ?Mallampati: II ? ?TM Distance: >3 FB ?Neck ROM: Full ? ? ? Dental ?no notable dental hx. ? ?  ?Pulmonary ?neg pulmonary ROS,  ?  ?Pulmonary exam normal ?breath sounds clear to auscultation ? ? ? ? ? ? Cardiovascular ?Exercise Tolerance: Good ?negative cardio ROS ?Normal cardiovascular exam ?Rhythm:Regular Rate:Normal ? ? ?  ?Neuro/Psych ?negative neurological ROS ? negative psych ROS  ? GI/Hepatic ?negative GI ROS, Neg liver ROS, PUD,   ?Endo/Other  ?negative endocrine ROS ? Renal/GU ?negative Renal ROS  ?negative genitourinary ?  ?Musculoskeletal ? ? Abdominal ?  ?Peds ? Hematology ?negative hematology ROS ?(+)   ?Anesthesia Other Findings ? ? Reproductive/Obstetrics ?negative OB ROS ? ?  ? ? ? ? ? ? ? ? ? ? ? ? ? ?  ?  ? ? ? ? ? ? ? ? ?Anesthesia Physical ?Anesthesia Plan ? ?ASA: 2 ? ?Anesthesia Plan: General  ? ?Post-op Pain Management:   ? ?Induction:  ? ?PONV Risk Score and Plan: 3 and TIVA, Propofol infusion and Treatment may vary due to age or medical condition ? ?Airway Management Planned:  ? ?Additional Equipment:  ? ?Intra-op Plan:  ? ?Post-operative Plan:  ? ?Informed Consent: I have reviewed the patients History and Physical, chart, labs and discussed the procedure including the risks, benefits and alternatives for the proposed anesthesia with the patient or authorized representative who has indicated his/her understanding and acceptance.  ? ? ? ?Dental Advisory Given ? ?Plan Discussed with: CRNA ? ?Anesthesia Plan Comments:   ? ? ? ? ? ? ?Anesthesia Quick Evaluation ? ?

## 2021-09-21 NOTE — Op Note (Signed)
[facility] ?Gastroenterology ?Patient Name: Karen Tran ?Procedure Date: 09/21/2021 7:19 AM ?MRN: [id number] ?Account #: [id number] ?Date of Birth: 05-16-1956 ?Admit Type: Outpatient ?Age: 66 ?Room: [facility] OR ROOM 01 ?Gender: Female ?Note Status: Finalized ?Instrument Name: [id number] ?Procedure:             Colonoscopy ?Indications:           High risk colon cancer surveillance: Personal history  ?                       of colonic polyps ?Providers:             Lucilla Lame MD, MD ?Referring MD:          Leonie Douglas. Doy Hutching, MD (Referring MD) ?Medicines:             Propofol per Anesthesia ?Complications:         No immediate complications. ?Procedure:             Pre-Anesthesia Assessment: ?                       - Prior to the procedure, a History and Physical was  ?                       performed, and patient medications and allergies were  ?                       reviewed. The patient's tolerance of previous  ?                       anesthesia was also reviewed. The risks and benefits  ?                       of the procedure and the sedation options and risks  ?                       were discussed with the patient. All questions were  ?                       answered, and informed consent was obtained. Prior  ?                       Anticoagulants: The patient has taken no previous  ?                       anticoagulant or antiplatelet agents. ASA Grade  ?                       Assessment: II - A patient with mild systemic disease.  ?                       After reviewing the risks and benefits, the patient  ?                       was deemed in satisfactory condition to undergo the  ?                       procedure. ?  After obtaining informed consent, the colonoscope was  ?                       passed under direct vision. Throughout the procedure,  ?                       the patient's blood pressure, pulse, and oxygen  ?                       saturations were monitored  continuously. The was  ?                       introduced through the anus and advanced to the the  ?                       cecum, identified by appendiceal orifice and ileocecal  ?                       valve. The colonoscopy was performed without  ?                       difficulty. The patient tolerated the procedure well.  ?                       The quality of the bowel preparation was excellent. ?Findings: ?     The perianal and digital rectal examinations were normal. ?     Non-bleeding internal hemorrhoids were found during retroflexion. The  ?     hemorrhoids were Grade I (internal hemorrhoids that do not prolapse). ?Impression:            - Non-bleeding internal hemorrhoids. ?                       - No specimens collected. ?Recommendation:        - Discharge patient to home. ?                       - Resume previous diet. ?                       - Continue present medications. ?                       - Repeat colonoscopy in 7 years for surveillance. ?Procedure Code(s):     --- Professional --- ?                       [phone], Colonoscopy, flexible; diagnostic, including  ?                       collection of specimen(s) by brushing or washing, when  ?                       performed (separate procedure) ?Diagnosis Code(s):     --- Professional --- ?                       Z86.010, Personal history of colonic polyps ?CPT copyright 2019 [employer]. All rights reserved. ?The codes documented in this report are preliminary and upon coder review may  ?be revised  to meet current compliance requirements. ?Lucilla Lame MD, MD ?09/21/2021 8:00:02 AM ?This report has been signed electronically. ?Number of Addenda: 0 ?Note Initiated On: 09/21/2021 7:19 AM ?Scope Withdrawal Time: 0 hours 13 minutes 28 seconds  ?Total Procedure Duration: 0 hours 19 minutes 56 seconds  ?Estimated Blood Loss:  Estimated blood loss: none. ?     [facility] ?

## 2021-09-22 ENCOUNTER — Encounter: Payer: Self-pay | Admitting: Gastroenterology

## 2021-10-20 ENCOUNTER — Other Ambulatory Visit: Payer: Self-pay | Admitting: Neurology

## 2021-10-20 DIAGNOSIS — H9190 Unspecified hearing loss, unspecified ear: Secondary | ICD-10-CM

## 2021-10-22 ENCOUNTER — Other Ambulatory Visit: Payer: Self-pay | Admitting: Neurology

## 2021-10-22 ENCOUNTER — Other Ambulatory Visit: Payer: Self-pay | Admitting: Obstetrics and Gynecology

## 2021-10-22 DIAGNOSIS — Z1231 Encounter for screening mammogram for malignant neoplasm of breast: Secondary | ICD-10-CM

## 2021-10-22 DIAGNOSIS — H9190 Unspecified hearing loss, unspecified ear: Secondary | ICD-10-CM

## 2021-11-05 ENCOUNTER — Ambulatory Visit
Admission: RE | Admit: 2021-11-05 | Discharge: 2021-11-05 | Disposition: A | Discharge status: Home / Self Care | Payer: Managed Care, Other (non HMO) | Source: Ambulatory Visit | Attending: Neurology | Admitting: Neurology

## 2021-11-05 DIAGNOSIS — H9190 Unspecified hearing loss, unspecified ear: Secondary | ICD-10-CM | POA: Diagnosis present

## 2021-12-28 ENCOUNTER — Ambulatory Visit
Admission: RE | Admit: 2021-12-28 | Discharge: 2021-12-28 | Disposition: A | Discharge status: Home / Self Care | Payer: Managed Care, Other (non HMO) | Source: Ambulatory Visit | Attending: Obstetrics and Gynecology | Admitting: Obstetrics and Gynecology

## 2021-12-28 DIAGNOSIS — Z1231 Encounter for screening mammogram for malignant neoplasm of breast: Secondary | ICD-10-CM | POA: Insufficient documentation

## 2022-03-16 ENCOUNTER — Encounter: Payer: Self-pay | Admitting: Occupational Therapy

## 2022-03-16 ENCOUNTER — Ambulatory Visit: Payer: Managed Care, Other (non HMO) | Attending: Orthopedic Surgery | Admitting: Occupational Therapy

## 2022-03-16 DIAGNOSIS — L905 Scar conditions and fibrosis of skin: Secondary | ICD-10-CM | POA: Diagnosis present

## 2022-03-16 DIAGNOSIS — M25531 Pain in right wrist: Secondary | ICD-10-CM | POA: Insufficient documentation

## 2022-03-16 NOTE — Therapy (Signed)
[facility] [address] [place], [address] Phone: [phone]   Fax:  [phone]  Occupational Therapy Evaluation  Patient Details  Name: Karen Tran MRN: [id number] Date of Birth: 11-24-1955 Referring Provider (OT): [place]   Encounter Date: 03/16/2022   OT End of Session - 03/16/22 2051     Visit Number 1    Number of Visits 6    Date for OT Re-Evaluation 04/27/22    OT Start Time 1310    OT Stop Time 1350    OT Time Calculation (min) 40 min    Activity Tolerance Patient tolerated treatment well    Behavior During Therapy [facility] for tasks assessed/performed             History reviewed. No pertinent past medical history.  Past Surgical History:  Procedure Laterality Date   COLONOSCOPY WITH PROPOFOL N/A 09/24/2016   Procedure: COLONOSCOPY WITH PROPOFOL;  Surgeon: Lucilla Lame, MD;  Location: [place];  Service: Endoscopy;  Laterality: N/A;   COLONOSCOPY WITH PROPOFOL N/A 09/21/2021   Procedure: COLONOSCOPY WITH PROPOFOL;  Surgeon: Lucilla Lame, MD;  Location: [place];  Service: Endoscopy;  Laterality: N/A;  NEEDS EARLY APPOINTMENT   OVARY SURGERY     removal   POLYPECTOMY  09/24/2016   Procedure: POLYPECTOMY;  Surgeon: Lucilla Lame, MD;  Location: [place];  Service: Endoscopy;;    There were no vitals filed for this visit.   Subjective Assessment - 03/16/22 2041     Subjective  The scar is just tender and still pain with certain motion or to touch- the worse when I hit it by accident - I noticed I do not have the strength I use to have    Pertinent History Ortho visit on 03/03/22 status post right DeQuervain release, on 02/01/2022. had sutures removed 02/15/22. Having some soreness along the radial styloid  at scar. Patient's last visit she received a CMC injection which completely resolved her CMC pain. She is still having some scar tissue pain along the  Dequerveins tenosynovitis incision site. Overall Dequerveins tenosynovitis are 75% better. 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 Yes    Pain Score 4     Pain Location Wrist    Pain Orientation Right    Pain Descriptors / Indicators Tender;Tightness    Pain Type Surgical pain    Pain Onset More than a month ago    Pain Frequency Intermittent               [facility] OT Assessment - 03/16/22 0001       Assessment   Medical Diagnosis R Dequervain release    Referring Provider (OT) Arvella Nigh PA    Onset Date/Surgical Date 02/01/22    Hand Dominance Right    Next MD Visit middle [place] With Spouse      AROM   Right Wrist Extension 85 Degrees    Right Wrist Flexion 85 Degrees   pain end range   Right Wrist Radial Deviation 35 Degrees   pain   Right Wrist Ulnar Deviation 27 Degrees   pull     Strength   Right Hand Grip (lbs) 49  Right Hand Lateral Pinch 10 lbs    Right Hand 3 Point Pinch 11 lbs    Left Hand Grip (lbs) 46    Left Hand Lateral Pinch 9 lbs    Left Hand 3 Point Pinch 9 lbs               moist heat prior to review of HEP  Cica scar pad for soft tissue massage - manual - pt ed on doing at home Cica scar pad for night time  AAROM for wrist in end range in all planes  Thumb PA and RA - and opposition to all digits and base of 5th sliding -  12 reps  Desensitizations  different textures - massage, rougher  textures  Several times day - for short periods of time   3 x day                OT Education - 03/16/22 2050     Education Details Findings of eval and HEP    Person(s) Educated Patient    Methods Explanation;Demonstration;Tactile cues;Verbal cues;Handout    Comprehension Verbal cues required;Returned demonstration;Verbalized understanding                  OT Long Term Goals - 03/16/22 2058       OT LONG TERM GOAL #1   Title Pt to be independent in HEP to decrease scar tissue and pain to increase AROM end range to be pain free    Baseline scar tender and pain with end range - 3-4/10    Time 6    Period Weeks    Status New    Target Date 04/27/22      OT LONG TERM GOAL #2   Title Pt hyper sensitivity at scar improve for pt to tolerate massage, rougher textures and tapping  to return to prior level of function    Baseline hyper sensitity to massage, rougher textures- tap dry with towel , no tapping    Time 3    Period Weeks    Status New    Target Date 04/06/22      OT LONG TERM GOAL #3   Title R grip increase with more than 5 lbs and prehension with 1-2 lbs to be able to open jars or bottles and push w/c at hospital    Baseline report decrease strength pushing w/c and opening jars- Grip R 49, L 46, Lat 10 lbs, 3 point 11 lbs - and L 9 lbs for lat / 3 point    Time 6    Period Weeks    Status New    Target Date 04/27/22                   Plan - 03/16/22 2053     Clinical Impression Statement Pt present at OT eval 6 wks s/p R de Quervain release - Pt with increase pain, tenderness and hyper sensitivtity at scar. Increase discomfort at end range R wrist AROM with decrease grip and prehension strenght limiting her functional use R dominant hand -pt can benefit from skilled OT services to decrease scar tissue and increase pain free ROM and strength.    OT Occupational Profile and History Problem Focused Assessment - Including review of records relating to presenting problem    Occupational performance deficits (Please refer to evaluation for details): ADL's;IADL's;Play;Leisure;Rest and Sleep    Body Structure / Function / Physical Skills ADL;Strength;Pain;UE functional use;IADL;ROM;Scar mobility    Rehab  Potential Good    Clinical Decision Making Limited treatment options, no task modification necessary     Comorbidities Affecting Occupational Performance: None    Modification or Assistance to Complete Evaluation  No modification of tasks or assist necessary to complete eval    OT Frequency 1x / week    OT Duration 6 weeks    OT Treatment/Interventions Self-care/ADL training;Moist Heat;Paraffin;Fluidtherapy;Scar mobilization;Passive range of motion;Manual Therapy;Patient/family education;Therapeutic exercise    Consulted and Agree with Plan of Care Patient             Patient will benefit from skilled therapeutic intervention in order to improve the following deficits and impairments:   Body Structure / Function / Physical Skills: ADL, Strength, Pain, UE functional use, IADL, ROM, Scar mobility       Visit Diagnosis: Scar condition and fibrosis of skin  Pain in right wrist    Problem List Patient Active Problem List   Diagnosis Date Noted   Personal history of colonic polyps    Special screening for malignant neoplasms, colon    Polyp of sigmoid colon    Hepatitis 08/20/2016   HLD (hyperlipidemia) 08/20/2016   Gastroduodenal ulcer 08/20/2016    Rosalyn Gess, OTR/L,CLT 03/16/2022, 9:03 PM  [facility] [address], [place], [address] Phone: [phone]   Fax:  [phone]  Name: Karen Tran MRN: [id number] Date of Birth: 02-18-1956

## 2022-03-16 NOTE — Addendum Note (Signed)
Addended by: Rosalyn Gess on: 03/16/2022 09:08 PM   Modules accepted: Orders

## 2022-03-24 ENCOUNTER — Ambulatory Visit: Payer: Managed Care, Other (non HMO) | Attending: Orthopedic Surgery | Admitting: Occupational Therapy

## 2022-03-24 DIAGNOSIS — M25531 Pain in right wrist: Secondary | ICD-10-CM | POA: Diagnosis present

## 2022-03-24 DIAGNOSIS — L905 Scar conditions and fibrosis of skin: Secondary | ICD-10-CM | POA: Insufficient documentation

## 2022-03-24 NOTE — Therapy (Signed)
[facility] [address] [place], [address] Phone: [phone]   Fax:  [phone]  Occupational Therapy Treatment  Patient Details  Name: Karen Tran MRN: [id number] Date of Birth: 1956-03-14 Referring Provider (OT): [place]   Encounter Date: 03/24/2022   OT End of Session - 03/24/22 1720     Visit Number 2    Number of Visits 6    Date for OT Re-Evaluation 04/27/22    OT Start Time 1616    OT Stop Time 1643    OT Time Calculation (min) 27 min    Activity Tolerance Patient tolerated treatment well    Behavior During Therapy [facility] for tasks assessed/performed             No past medical history on file.  Past Surgical History:  Procedure Laterality Date   COLONOSCOPY WITH PROPOFOL N/A 09/24/2016   Procedure: COLONOSCOPY WITH PROPOFOL;  Surgeon: Lucilla Lame, MD;  Location: [place];  Service: Endoscopy;  Laterality: N/A;   COLONOSCOPY WITH PROPOFOL N/A 09/21/2021   Procedure: COLONOSCOPY WITH PROPOFOL;  Surgeon: Lucilla Lame, MD;  Location: [place];  Service: Endoscopy;  Laterality: N/A;  NEEDS EARLY APPOINTMENT   OVARY SURGERY     removal   POLYPECTOMY  09/24/2016   Procedure: POLYPECTOMY;  Surgeon: Lucilla Lame, MD;  Location: [place];  Service: Endoscopy;;    There were no vitals filed for this visit.   Subjective Assessment - 03/24/22 1719     Subjective  I am using my hand and everything.  Normally.  It is just a certain motion or when I hit the side of my wrist.  Still little tight or a pull at that end range and then tender.    Pertinent History Ortho visit on 03/03/22 status post right DeQuervain release, on 02/01/2022. had sutures removed 02/15/22. Having some soreness along the radial styloid  at scar. Patient's last visit she received a CMC injection which completely resolved her CMC pain. She is still having some scar tissue pain along the Dequerveins  tenosynovitis incision site. Overall Dequerveins tenosynovitis are 75% better. 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 Yes    Pain Score 2     Pain Location Wrist    Pain Orientation Right    Pain Descriptors / Indicators Tender;Tightness    Pain Type Surgical pain    Pain Onset More than a month ago    Pain Frequency Occasional                [facility] OT Assessment - 03/24/22 0001       AROM   Right Wrist Extension 85 Degrees    Right Wrist Flexion 85 Degrees   2/10 pull   Right Wrist Radial Deviation 35 Degrees   pain end range 4/10   Right Wrist Ulnar Deviation 27 Degrees   pull 2/10     Strength   Right Hand Grip (lbs) 54    Right Hand Lateral Pinch 10 lbs    Right Hand 3 Point Pinch 10 lbs    Left Hand Grip (lbs) 46    Left Hand Lateral Pinch 9 lbs    Left Hand 3 Point Pinch 9 lbs  Patient with active range of motion in the right wrist in all planes within normal limits.  Slight pull at endrange except radial deviation 4/10 discomfort over scar. Less pain and discomfort after Feudo therapy.  With a 1-2/10 and radial deviation.  No pain in others.       OT Treatments/Exercises (OP) - 03/24/22 0001       RUE Fluidotherapy   Number Minutes Fluidotherapy 8 Minutes    RUE Fluidotherapy Location Hand;Wrist    Comments Active range of motion in all planes to decrease pain and stiffness endrange           moist heat prior to review of HEP  Cica scar pad for soft tissue massage - manual - pt ed on doing at home Cica scar pad for night time  AAROM for wrist in end range in all planes -change wrist flexion to composite flexion stretch. As well as prior to radial and ulnar deviation to do active assisted range of motion at edge of table.  10 reps. Thumb PA and RA - and opposition to all digits and  base of 5th sliding -  12 reps pain-free after Feudo. Desensitizations  different textures - massage, rougher  textures and tapping to continue with at home Several times day - for short periods of time    2 times a day-desensitization several times during the day Patient to follow-up with me in 3 to 4 weeks.  Patient do taking some trips. 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 Patient arrive reporting cutting with scissors is painful.  Advised patient to get a spring-loaded scissors that is better for [facility] arthritis as well as for tenosynovitis.          OT Education - 03/24/22 1720     Education Details Progress and changes to home program    Person(s) Educated Patient    Methods Explanation;Demonstration;Tactile cues;Verbal cues;Handout    Comprehension Verbal cues required;Returned demonstration;Verbalized understanding                 OT Long Term Goals - 03/16/22 2058       OT LONG TERM GOAL #1   Title Pt to be independent in HEP to decrease scar tissue and pain to increase AROM end range to be pain free    Baseline scar tender and pain with end range - 3-4/10    Time 6    Period Weeks    Status New    Target Date 04/27/22      OT LONG TERM GOAL #2   Title Pt hyper sensitivity at scar improve for pt to tolerate massage, rougher textures and tapping  to return to prior level of function    Baseline hyper sensitity to massage, rougher textures- tap dry with towel , no tapping    Time 3    Period Weeks    Status New    Target Date 04/06/22      OT LONG TERM GOAL #3   Title R grip increase with more than 5 lbs and prehension with 1-2 lbs to be able to open jars or bottles and push w/c at hospital    Baseline report decrease strength pushing w/c and opening jars- Grip R 49, L 46, Lat 10 lbs, 3 point 11 lbs - and L 9 lbs for lat / 3 point    Time 6  Period Weeks     Status New    Target Date 04/27/22                   Plan - 03/24/22 1720     Clinical Impression Statement Pt had 02/01/22  R de Quervain release done by Dr Rudene Christians -  Pt show since last session increase AROM in wrist with pain at end range 1-2/10 except RD 4/10 - after Fluido pt had less pain and more of tightness at end range for compostie flexion and RD/UD. Also less pain with end range thumb RA - PA - but all motion WNL - strength 5/5 in all planes. Grip increase but prehension about same but no pain . Pt cont to have some hyper sensitivtity at scar for massage , rougher textures and tapping - pt to cont with and add AAROM for RD, UD prior to AROM - pt to cont with desentitization-  and ROM - to increase functional use R dominant hand in ADL's and IADL's  -pt can benefit from skilled OT services to decrease scar tissue and increase pain free ROM and strength.    OT Occupational Profile and History Problem Focused Assessment - Including review of records relating to presenting problem    Occupational performance deficits (Please refer to evaluation for details): ADL's;IADL's;Play;Leisure;Rest and Sleep    Body Structure / Function / Physical Skills ADL;Strength;Pain;UE functional use;IADL;ROM;Scar mobility    Rehab Potential Good    Clinical Decision Making Limited treatment options, no task modification necessary    Comorbidities Affecting Occupational Performance: None    Modification or Assistance to Complete Evaluation  No modification of tasks or assist necessary to complete eval    OT Frequency Biweekly    OT Duration 4 weeks    OT Treatment/Interventions Self-care/ADL training;Moist Heat;Paraffin;Fluidtherapy;Scar mobilization;Passive range of motion;Manual Therapy;Patient/family education;Therapeutic exercise    Consulted and Agree with Plan of Care Patient             Patient will benefit from skilled therapeutic intervention in order to improve the following deficits  and impairments:   Body Structure / Function / Physical Skills: ADL, Strength, Pain, UE functional use, IADL, ROM, Scar mobility       Visit Diagnosis: Scar condition and fibrosis of skin  Pain in right wrist    Problem List Patient Active Problem List   Diagnosis Date Noted   Personal history of colonic polyps    Special screening for malignant neoplasms, colon    Polyp of sigmoid colon    Hepatitis 08/20/2016   HLD (hyperlipidemia) 08/20/2016   Gastroduodenal ulcer 08/20/2016    Karen Tran, OTR/L,CLT 03/24/2022, 5:28 PM  [facility] [facility] [address], [place], [address] Phone: [phone]   Fax:  [phone]  Name: Karen Tran MRN: [id number] Date of Birth: Feb 01, 1956

## 2022-04-19 ENCOUNTER — Encounter: Payer: Managed Care, Other (non HMO) | Admitting: Occupational Therapy

## 2022-04-20 ENCOUNTER — Ambulatory Visit: Payer: Managed Care, Other (non HMO) | Admitting: Occupational Therapy

## 2022-04-20 ENCOUNTER — Encounter: Payer: Self-pay | Admitting: Occupational Therapy

## 2022-04-20 DIAGNOSIS — L905 Scar conditions and fibrosis of skin: Secondary | ICD-10-CM | POA: Diagnosis not present

## 2022-04-20 DIAGNOSIS — M25531 Pain in right wrist: Secondary | ICD-10-CM

## 2022-04-20 NOTE — Therapy (Signed)
[facility] [address], [place], [address] Phone: [phone]   Fax:  [phone]  Occupational Therapy Treatment  Patient Details  Name: Karen Tran MRN: [id number] Date of Birth: 28-Nov-1955 Referring Provider (OT): [place]   Encounter Date: 04/20/2022   OT End of Session - 04/20/22 1514     Visit Number 3    Number of Visits 6    Date for OT Re-Evaluation 04/27/22    OT Start Time 8938    OT Stop Time 1512    OT Time Calculation (min) 25 min    Activity Tolerance Patient tolerated treatment well    Behavior During Therapy [facility] for tasks assessed/performed             History reviewed. No pertinent past medical history.  Past Surgical History:  Procedure Laterality Date   COLONOSCOPY WITH PROPOFOL N/A 09/24/2016   Procedure: COLONOSCOPY WITH PROPOFOL;  Surgeon: Lucilla Lame, MD;  Location: [place];  Service: Endoscopy;  Laterality: N/A;   COLONOSCOPY WITH PROPOFOL N/A 09/21/2021   Procedure: COLONOSCOPY WITH PROPOFOL;  Surgeon: Lucilla Lame, MD;  Location: [place];  Service: Endoscopy;  Laterality: N/A;  NEEDS EARLY APPOINTMENT   OVARY SURGERY     removal   POLYPECTOMY  09/24/2016   Procedure: POLYPECTOMY;  Surgeon: Lucilla Lame, MD;  Location: [place];  Service: Endoscopy;;    There were no vitals filed for this visit.   Subjective Assessment - 04/20/22 1513     Subjective  Vacation was good.  I am using my hand and most everything.  Not really tightening more tender maybe just a little bit with rougher textures remember bumping it.    Pertinent History Ortho visit on 03/03/22 status post right DeQuervain release, on 02/01/2022. had sutures removed 02/15/22. Having some soreness along the radial styloid  at scar. Patient's last visit she received a CMC injection which completely resolved her CMC pain. She is still having some scar tissue pain along the  Dequerveins tenosynovitis incision site. Overall Dequerveins tenosynovitis are 75% better. 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 No/denies           AROM in wrist in all planes within normal limits.  No pain just a slight pull or tightness and ulnar deviation  Thumb PA and RA - and opposition  Strength for thumb in all planes as well as wrist 5/5 pain-free.  Desensitizations  different textures - massage, rougher  textures and tapping to continue with at home Several times day - for short periods of time  Doing much better.   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 Patient did do modifications and got a spring-loaded scissors. Reviewed with patient joint protection and modifications with using larger joints or avoid tight prolonged grip or pinches Building up grips and handles. 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 Tenderness over CMC of bilateral thumbs. Patient to follow-up with me if needed otherwise we will discharge her in 2 weeks.  OT Education - 04/20/22 1513     Education Details Progress and changes to home program    Person(s) Educated Patient    Methods Explanation;Demonstration;Tactile cues;Verbal cues;Handout    Comprehension Verbal cues required;Returned demonstration;Verbalized understanding                 OT Long Term Goals - 04/20/22 1525       OT LONG TERM GOAL #1   Title Pt to be independent in HEP to decrease scar tissue and pain to increase AROM end range to be pain free    Status Achieved      OT LONG TERM GOAL #2   Title Pt hyper sensitivity at scar improve for pt  to tolerate massage, rougher textures and tapping  to return to prior level of function    Baseline hyper sensitity to massage, rougher textures- tap dry with towel , no tapping- Pt can tolerate massage, soft textures and vibartion - rougher text and tapping feeling it but less than 2/10    Time 2    Period Weeks    Status On-going    Target Date 04/27/22      OT LONG TERM GOAL #3   Title R grip increase with more than 5 lbs and prehension with 1-2 lbs to be able to open jars or bottles and push w/c at hospital    Baseline report decrease strength pushing w/c and opening jars- Grip R 49, L 46, Lat 10 lbs, 3 point 11 lbs - and L 9 lbs for lat / 3 point NOW can use hand in most everything - tight feeling in composite UD  but increase grip 52lbs , lat and 3 point 10 lbs    Status Achieved                   Plan - 04/20/22 1514     Clinical Impression Statement Pt had 02/01/22  R de Quervain release done by Dr Rudene Christians - 11 wks s/p -d-patient show active range of motion in all planes for wrist and thumb within normal limits.  Strength 5/5 in all planes with no pain mostly just some tightness in a composite ulnar deviation/simulated Wynn Maudlin test..  Patient still somewhat tender for rougher textures and tapping but can tolerate soft textures, massage and vibration.. Grip and prehension strength increased since last time.  Did had minimal pain with lateral pinch.  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  Patient was educated on joint protection and modifications to use in ADLs and IADLs.  Patient to continue to work on range of motion as well as desensitization.  Patient to follow-up with me as needed in the next 2 weeks otherwise we will discharge her.    OT Occupational Profile and History Problem Focused Assessment - Including review of records relating to presenting problem     Occupational performance deficits (Please refer to evaluation for details): ADL's;IADL's;Play;Leisure;Rest and Sleep    Body Structure / Function / Physical Skills ADL;Strength;Pain;UE functional use;IADL;ROM;Scar mobility    Rehab Potential Good    Clinical Decision Making Limited treatment options, no task modification necessary    Comorbidities Affecting Occupational Performance: None    Modification or Assistance to Complete Evaluation  No modification of tasks or assist necessary to complete eval    OT Frequency Biweekly    OT Duration 2 weeks  OT Treatment/Interventions Self-care/ADL training;Moist Heat;Paraffin;Fluidtherapy;Scar mobilization;Passive range of motion;Manual Therapy;Patient/family education;Therapeutic exercise    Consulted and Agree with Plan of Care Patient             Patient will benefit from skilled therapeutic intervention in order to improve the following deficits and impairments:   Body Structure / Function / Physical Skills: ADL, Strength, Pain, UE functional use, IADL, ROM, Scar mobility       Visit Diagnosis: Scar condition and fibrosis of skin  Pain in right wrist    Problem List Patient Active Problem List   Diagnosis Date Noted   Personal history of colonic polyps    Special screening for malignant neoplasms, colon    Polyp of sigmoid colon    Hepatitis 08/20/2016   HLD (hyperlipidemia) 08/20/2016   Gastroduodenal ulcer 08/20/2016    Rosalyn Gess, OTR/L,CLT 04/20/2022, 3:27 PM  [facility] [address], [place], [address] Phone: [phone]   Fax:  [phone]  Name: MILYN STAPLETON MRN: [id number] Date of Birth: 1955-11-14

## 2022-10-27 ENCOUNTER — Other Ambulatory Visit: Payer: Self-pay | Admitting: Obstetrics and Gynecology

## 2022-10-27 DIAGNOSIS — Z1231 Encounter for screening mammogram for malignant neoplasm of breast: Secondary | ICD-10-CM

## 2023-01-05 ENCOUNTER — Ambulatory Visit
Admission: RE | Admit: 2023-01-05 | Discharge: 2023-01-05 | Disposition: A | Discharge status: Home / Self Care | Payer: Medicare Other | Source: Ambulatory Visit | Attending: Obstetrics and Gynecology | Admitting: Obstetrics and Gynecology

## 2023-01-05 DIAGNOSIS — Z1231 Encounter for screening mammogram for malignant neoplasm of breast: Secondary | ICD-10-CM | POA: Diagnosis present

## 2023-02-28 ENCOUNTER — Other Ambulatory Visit: Payer: Self-pay | Admitting: Medical Genetics

## 2023-02-28 DIAGNOSIS — Z006 Encounter for examination for normal comparison and control in clinical research program: Secondary | ICD-10-CM

## 2023-05-05 ENCOUNTER — Other Ambulatory Visit
Admission: RE | Admit: 2023-05-05 | Discharge: 2023-05-05 | Disposition: A | Discharge status: Home / Self Care | Payer: Self-pay | Source: Ambulatory Visit | Attending: Medical Genetics | Admitting: Medical Genetics

## 2023-05-05 DIAGNOSIS — Z006 Encounter for examination for normal comparison and control in clinical research program: Secondary | ICD-10-CM | POA: Insufficient documentation

## 2023-05-14 LAB — HELIX MOLECULAR SCREEN: Genetic Analysis Overall Interpretation: NEGATIVE

## 2023-05-14 LAB — GENECONNECT MOLECULAR SCREEN

## 2023-09-13 ENCOUNTER — Other Ambulatory Visit: Payer: Self-pay | Admitting: Internal Medicine

## 2023-09-13 DIAGNOSIS — Z1231 Encounter for screening mammogram for malignant neoplasm of breast: Secondary | ICD-10-CM

## 2024-01-12 ENCOUNTER — Encounter

## 2024-01-19 ENCOUNTER — Ambulatory Visit
Admission: RE | Admit: 2024-01-19 | Discharge: 2024-01-19 | Disposition: A | Discharge status: Home / Self Care | Source: Ambulatory Visit | Attending: Internal Medicine | Admitting: Internal Medicine

## 2024-01-19 DIAGNOSIS — Z1231 Encounter for screening mammogram for malignant neoplasm of breast: Secondary | ICD-10-CM | POA: Diagnosis present
